# Patient Record
Sex: Female | Born: 1950 | Race: White | Hispanic: No | Marital: Married | Smoking: Former smoker
Health system: Southern US, Community
[De-identification: ages and names within clinical notes are randomized; demographics above are authoritative.]

## PROBLEM LIST (undated history)

## (undated) DIAGNOSIS — J45909 Unspecified asthma, uncomplicated: Secondary | ICD-10-CM

## (undated) HISTORY — PX: BREAST BIOPSY: SHX20

---

## 2015-06-04 ENCOUNTER — Emergency Department
Admission: EM | Admit: 2015-06-04 | Discharge: 2015-06-04 | Disposition: A | Discharge status: Home / Self Care | Payer: BLUE CROSS/BLUE SHIELD | Attending: Emergency Medicine | Admitting: Emergency Medicine

## 2015-06-04 ENCOUNTER — Emergency Department: Payer: BLUE CROSS/BLUE SHIELD

## 2015-06-04 ENCOUNTER — Encounter: Payer: Self-pay | Admitting: Emergency Medicine

## 2015-06-04 ENCOUNTER — Emergency Department
Admission: EM | Admit: 2015-06-04 | Discharge: 2015-06-04 | Disposition: A | Discharge status: Home / Self Care | Payer: BLUE CROSS/BLUE SHIELD | Source: Home / Self Care | Attending: Emergency Medicine | Admitting: Emergency Medicine

## 2015-06-04 DIAGNOSIS — R112 Nausea with vomiting, unspecified: Secondary | ICD-10-CM | POA: Diagnosis present

## 2015-06-04 DIAGNOSIS — Z791 Long term (current) use of non-steroidal anti-inflammatories (NSAID): Secondary | ICD-10-CM | POA: Diagnosis not present

## 2015-06-04 DIAGNOSIS — J45909 Unspecified asthma, uncomplicated: Secondary | ICD-10-CM | POA: Insufficient documentation

## 2015-06-04 DIAGNOSIS — M5412 Radiculopathy, cervical region: Secondary | ICD-10-CM

## 2015-06-04 DIAGNOSIS — R51 Headache: Secondary | ICD-10-CM | POA: Diagnosis not present

## 2015-06-04 DIAGNOSIS — Z87891 Personal history of nicotine dependence: Secondary | ICD-10-CM | POA: Insufficient documentation

## 2015-06-04 DIAGNOSIS — R519 Headache, unspecified: Secondary | ICD-10-CM

## 2015-06-04 HISTORY — DX: Unspecified asthma, uncomplicated: J45.909

## 2015-06-04 LAB — URINALYSIS COMPLETE WITH MICROSCOPIC (ARMC ONLY)
BACTERIA UA: NONE SEEN
Bilirubin Urine: NEGATIVE
Glucose, UA: NEGATIVE mg/dL
HGB URINE DIPSTICK: NEGATIVE
LEUKOCYTES UA: NEGATIVE
Nitrite: NEGATIVE
PH: 6 (ref 5.0–8.0)
PROTEIN: 100 mg/dL — AB
SPECIFIC GRAVITY, URINE: 1.027 (ref 1.005–1.030)

## 2015-06-04 LAB — COMPREHENSIVE METABOLIC PANEL
ALT: 39 U/L (ref 14–54)
AST: 27 U/L (ref 15–41)
Albumin: 4.8 g/dL (ref 3.5–5.0)
Alkaline Phosphatase: 109 U/L (ref 38–126)
Anion gap: 8 (ref 5–15)
BUN: 18 mg/dL (ref 6–20)
CHLORIDE: 105 mmol/L (ref 101–111)
CO2: 24 mmol/L (ref 22–32)
CREATININE: 0.55 mg/dL (ref 0.44–1.00)
Calcium: 9.2 mg/dL (ref 8.9–10.3)
Glucose, Bld: 131 mg/dL — ABNORMAL HIGH (ref 65–99)
POTASSIUM: 3.9 mmol/L (ref 3.5–5.1)
SODIUM: 137 mmol/L (ref 135–145)
Total Bilirubin: 0.6 mg/dL (ref 0.3–1.2)
Total Protein: 8.1 g/dL (ref 6.5–8.1)

## 2015-06-04 LAB — CBC
HEMATOCRIT: 40.7 % (ref 35.0–47.0)
HEMOGLOBIN: 14 g/dL (ref 12.0–16.0)
MCH: 30.9 pg (ref 26.0–34.0)
MCHC: 34.3 g/dL (ref 32.0–36.0)
MCV: 90.1 fL (ref 80.0–100.0)
PLATELETS: 336 10*3/uL (ref 150–440)
RBC: 4.51 MIL/uL (ref 3.80–5.20)
RDW: 13.1 % (ref 11.5–14.5)
WBC: 7.6 10*3/uL (ref 3.6–11.0)

## 2015-06-04 LAB — LIPASE, BLOOD: LIPASE: 21 U/L (ref 11–51)

## 2015-06-04 MED ORDER — IOHEXOL 350 MG/ML SOLN
100.0000 mL | Freq: Once | INTRAVENOUS | Status: AC | PRN
  Administered 2015-06-04: 100 mL via INTRAVENOUS

## 2015-06-04 MED ORDER — LORAZEPAM 2 MG/ML IJ SOLN
0.5000 mg | Freq: Once | INTRAMUSCULAR | Status: AC
  Administered 2015-06-04: 0.5 mg via INTRAVENOUS
  Filled 2015-06-04: qty 1

## 2015-06-04 MED ORDER — ONDANSETRON 4 MG PO TBDP
4.0000 mg | ORAL_TABLET | Freq: Once | ORAL | Status: AC
  Administered 2015-06-04: 4 mg via ORAL
  Filled 2015-06-04: qty 1

## 2015-06-04 MED ORDER — SODIUM CHLORIDE 0.9 % IV BOLUS (SEPSIS)
500.0000 mL | Freq: Once | INTRAVENOUS | Status: AC
  Administered 2015-06-04: 500 mL via INTRAVENOUS

## 2015-06-04 MED ORDER — ONDANSETRON 4 MG PO TBDP: 4.0000 mg | ORAL_TABLET | Freq: Three times a day (TID) | ORAL | Status: AC | PRN

## 2015-06-04 MED ORDER — ONDANSETRON HCL 4 MG/2ML IJ SOLN
4.0000 mg | Freq: Once | INTRAMUSCULAR | Status: AC
  Administered 2015-06-04: 4 mg via INTRAVENOUS
  Filled 2015-06-04: qty 2

## 2015-06-04 MED ORDER — ONDANSETRON HCL 4 MG/2ML IJ SOLN
INTRAMUSCULAR | Status: AC
  Administered 2015-06-04: 4 mg via INTRAVENOUS
  Filled 2015-06-04: qty 2

## 2015-06-04 MED ORDER — ONDANSETRON HCL 4 MG PO TABS: 4.0000 mg | ORAL_TABLET | Freq: Every day | ORAL | Status: AC | PRN

## 2015-06-04 MED ORDER — ONDANSETRON HCL 4 MG/2ML IJ SOLN
4.0000 mg | Freq: Once | INTRAMUSCULAR | Status: AC
  Administered 2015-06-04: 4 mg via INTRAVENOUS

## 2015-06-04 MED ORDER — HYDROMORPHONE HCL 1 MG/ML IJ SOLN
0.5000 mg | Freq: Once | INTRAMUSCULAR | Status: AC
Start: 2015-06-04 — End: 2015-06-04
  Administered 2015-06-04: 0.5 mg via INTRAVENOUS
  Filled 2015-06-04: qty 1

## 2015-06-04 MED ORDER — OXYCODONE HCL 5 MG PO TABS
5.0000 mg | ORAL_TABLET | Freq: Once | ORAL | Status: AC
  Administered 2015-06-04: 5 mg via ORAL
  Filled 2015-06-04: qty 1

## 2015-06-04 MED ORDER — OXYCODONE-ACETAMINOPHEN 5-325 MG PO TABS: 1.0000 | ORAL_TABLET | ORAL | Status: AC | PRN

## 2015-06-04 MED ORDER — SODIUM CHLORIDE 0.9 % IV SOLN
Freq: Once | INTRAVENOUS | Status: AC
  Administered 2015-06-04: 16:00:00 via INTRAVENOUS

## 2015-06-04 MED ORDER — OXYCODONE-ACETAMINOPHEN 5-325 MG PO TABS
1.0000 | ORAL_TABLET | Freq: Once | ORAL | Status: AC
  Administered 2015-06-04: 1 via ORAL
  Filled 2015-06-04: qty 1

## 2015-06-04 NOTE — ED Notes (Signed)
Pt returned from CT. NAD noted.

## 2015-06-04 NOTE — ED Provider Notes (Signed)
Signout from Dr. Mayford KnifeWilliams to follow up with the patient's MRI MRA. Patient was seen already this morning and diagnosed with cervical radiculopathy after noncontrast CAT scans. Then followed up with her orthopedic doctor, Dr. Lesleigh NoeKosinski who sent her to the emergency department. Physical Exam  BP 136/77 mmHg  Pulse 82  Temp(Src) 97.5 F (36.4 C) (Oral)  Resp 18  Ht 5' (1.524 m)  Wt 175 lb (79.379 kg)  BMI 34.18 kg/m2  SpO2 97% ----------------------------------------- 9:41 PM on 06/04/2015 -----------------------------------------  CT Angio Neck W/Cm &/Or Wo/Cm (Final result) Result time: 06/04/15 20:47:29   Final result by Rad Results In Interface (06/04/15 20:47:29)   Narrative:   CLINICAL DATA: Abnormal left vertebral artery on MRA. Rule out dissection. Headache and vomiting.  EXAM: CT ANGIOGRAPHY NECK  TECHNIQUE: Multidetector CT imaging of the neck was performed using the standard protocol during bolus administration of intravenous contrast. Multiplanar CT image reconstructions and MIPs were obtained to evaluate the vascular anatomy. Carotid stenosis measurements (when applicable) are obtained utilizing NASCET criteria, using the distal internal carotid diameter as the denominator.  CONTRAST: 100mL OMNIPAQUE IOHEXOL 350 MG/ML SOLN  COMPARISON: MRA head today  FINDINGS: Aortic arch: Mild atherosclerotic calcification in the aortic arch. No aneurysm or dissection. Proximal great vessels patent. Lung apices clear  Right carotid system: Right carotid widely patent. No atherosclerotic disease or dissection  Left carotid system: left carotid widely patent. No atherosclerotic disease or dissection  Vertebral arteries:The right vertebral artery is normal and widely patent. Left vertebral artery is hypoplastic and diffusely small. No caliber change or dissection. This accounts for the MRA head finding. There is a very small left vertebral artery contributing to left  PICA and the basilar. Right PICA also patent.  Skeleton: Mild disc degeneration and spurring. No acute skeletal abnormality.  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 No evidence of dissection.   Electronically Signed By: Marlan Palauharles Clark M.D. On: 06/04/2015 20:47          MR Brain Wo Contrast (Final result) Result time: 06/04/15 17:19:25   Final result by Rad Results In Interface (06/04/15 17:19:25)   Narrative:   CLINICAL DATA: Severe headache. Neck pain and vomiting.  EXAM: MRI HEAD WITHOUT CONTRAST  MRA HEAD WITHOUT CONTRAST  TECHNIQUE: Multiplanar, multiecho pulse sequences of the brain and surrounding structures were obtained without intravenous contrast. Angiographic images of the head were obtained using MRA technique without contrast.  COMPARISON: CT head 06/04/2015  FINDINGS: MRI HEAD FINDINGS  Negative for acute infarct. Several small white matter hyperintensities bilaterally appear chronic and nonspecific. Brainstem and cerebellum normal. Basal ganglia normal.  Ventricle size normal. Cerebral volume normal.  Negative for intracranial hemorrhage. Negative for mass or edema.  Pituitary normal in size. Paranasal sinuses clear. Normal orbit.  MRA HEAD FINDINGS  Mild stenosis distal right vertebral artery. Right PICA patent.  Left vertebral artery not visualized and may be occluded. Basilar is patent. Superior cerebellar and posterior cerebral arteries patent bilaterally.  Internal carotid artery widely patent without stenosis or aneurysm. Anterior and middle cerebral arteries widely patent bilaterally. No vascular malformation identified.  IMPRESSION: No acute intracranial abnormality. Small white matter hyperintensities are nonspecific but likely related to chronic microvascular  ischemia.  Nonvisualization of distal left vertebral artery. This could be due to acute or chronic occlusion. If there is concern of vertebral artery dissection, CTA of the neck is suggested for further  evaluation.   Electronically Signed By: Marlan Palau M.D. On: 06/04/2015 17:19          MR Angiogram Head Wo Contrast (Final result) Result time: 06/04/15 17:19:25   Final result by Rad Results In Interface (06/04/15 17:19:25)   Narrative:   CLINICAL DATA: Severe headache. Neck pain and vomiting.  EXAM: MRI HEAD WITHOUT CONTRAST  MRA HEAD WITHOUT CONTRAST  TECHNIQUE: Multiplanar, multiecho pulse sequences of the brain and surrounding structures were obtained without intravenous contrast. Angiographic images of the head were obtained using MRA technique without contrast.  COMPARISON: CT head 06/04/2015  FINDINGS: MRI HEAD FINDINGS  Negative for acute infarct. Several small white matter hyperintensities bilaterally appear chronic and nonspecific. Brainstem and cerebellum normal. Basal ganglia normal.  Ventricle size normal. Cerebral volume normal.  Negative for intracranial hemorrhage. Negative for mass or edema.  Pituitary normal in size. Paranasal sinuses clear. Normal orbit.  MRA HEAD FINDINGS  Mild stenosis distal right vertebral artery. Right PICA patent.  Left vertebral artery not visualized and may be occluded. Basilar is patent. Superior cerebellar and posterior cerebral arteries patent bilaterally.  Internal carotid artery widely patent without stenosis or aneurysm. Anterior and middle cerebral arteries widely patent bilaterally. No vascular malformation identified.  IMPRESSION: No acute intracranial abnormality. Small white matter hyperintensities are nonspecific but likely related to chronic microvascular ischemia.  Nonvisualization of distal left vertebral artery. This could be due to acute or chronic occlusion. If there is  concern of vertebral artery dissection, CTA of the neck is suggested for further evaluation.   Electronically Signed By: Marlan Palau M.D. On: 06/04/2015 17:19        Physical Exam Patient resting comfortably at this time. Denies any pain. No tenderness palpation to the bilateral neck. ED Course  Procedures  MDM No acute findings on the patient's imaging. 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 I discussed the results with the patient as well as her family who is at the bedside. She'll be going home and will follow Dr. Martha Clan. She will continue to use aspirin. I'll be discharging her with Zofran. I also recommended that she try muscle cream such as Aspercreme or icy hot for continued relief. No acute findings found during extensive workup today.      Myrna Blazer, MD 06/05/15 [phone]

## 2015-06-04 NOTE — ED Notes (Signed)
Patient ambulatory to triage with steady gait, without difficulty or distress noted; pt reports recent sinus congestion & cough; now with HA from base of head radiating up

## 2015-06-04 NOTE — ED Notes (Signed)
Patient transported to CT 

## 2015-06-04 NOTE — ED Notes (Signed)
Pt returned from MRI °

## 2015-06-04 NOTE — ED Notes (Signed)
She was seen this am and instructed to go to another md concerning her neck/head pain .Jillian Kaiser. After she got there she developed vomiting and was instructed to return to ed

## 2015-06-04 NOTE — ED Provider Notes (Signed)
[facility] Emergency Department Provider Note     Time seen: ----------------------------------------- 3:11 PM on 06/04/2015 -----------------------------------------    I have reviewed the triage vital signs and the nursing notes.   HISTORY  Chief Complaint Emesis    HPI Jillian Kaiser is a 65 y.o. female who presents to ER for intense headache, neck pain and vomiting. Patient was seen here this morning, has a history of same chronically and had a negative CT head and C-spine earlier this morning. She was then referred for follow-up with orthopedics, will see the orthopedist and was told to come back to the ER because she was feeling worse and having more vomiting with worsening pain. Patient describes a shooting pain from her head or neck with associated dizziness. Nothing makes this better. She has no associated numbness tingling or weakness.   History reviewed. No pertinent past medical history.  There are no active problems to display for this patient.   History reviewed. No pertinent past surgical history.  Allergies Review of patient's allergies indicates no known allergies.  Social History Social History  Substance Use Topics  . Smoking status: Former Games developermoker  . Smokeless tobacco: None  . Alcohol Use: No    Review of Systems Constitutional: Negative for fever. Eyes: Negative for visual changes. ENT: Negative for sore throat. Cardiovascular: Negative for chest pain. Respiratory: Negative for shortness of breath. Gastrointestinal: Negative for abdominal pain, Positive for vomiting Genitourinary: Negative for dysuria. Musculoskeletal: Negative for back pain. Positive for neck pain Skin: Negative for rash. Neurological: Positive for headache  10-point ROS otherwise negative.  ____________________________________________   PHYSICAL EXAM:  VITAL SIGNS: ED Triage Vitals  Enc Vitals Group     BP 06/04/15 1159 152/83 mmHg      Pulse Rate 06/04/15 1159 70     Resp 06/04/15 1159 18     Temp 06/04/15 1159 97.5 F (36.4 C)     Temp Source 06/04/15 1159 Oral     SpO2 06/04/15 1159 98 %     Weight 06/04/15 1159 175 lb (79.379 kg)     Height 06/04/15 1159 5' (1.524 m)     Head Cir --      Peak Flow --      Pain Score 06/04/15 1200 10     Pain Loc --      Pain Edu? --      Excl. in GC? --    Constitutional: Alert and oriented. Mild distress Eyes: Conjunctivae are normal. PERRL. Normal extraocular movements. ENT   Head: Normocephalic and atraumatic.   Nose: No congestion/rhinnorhea.   Mouth/Throat: Mucous membranes are moist.   Neck: No stridor. Cardiovascular: Normal rate, regular rhythm. Normal and symmetric distal pulses are present in all extremities. No murmurs, rubs, or gallops. Respiratory: Normal respiratory effort without tachypnea nor retractions. Breath sounds are clear and equal bilaterally. No wheezes/rales/rhonchi. Gastrointestinal: Soft and nontender. No distention. No abdominal bruits.  Musculoskeletal: Nontender with normal range of motion in all extremities. No joint effusions.  No lower extremity tenderness nor edema. Neurologic:  Normal speech and language. No gross focal neurologic deficits are appreciated. Motor and sensory function appears to be intact, cranial nerves appear to be intact. Skin:  Skin is warm, dry and intact. No rash noted. Psychiatric: Mood and affect are normal. Speech and behavior are normal. Patient exhibits appropriate insight and judgment. ____________________________________________  ED COURSE:  Pertinent labs & imaging results that were available during my care of the patient were reviewed by  me and considered in my medical decision making (see chart for details). Patient with severe posterior headache and vomiting of uncertain etiology. I ordered an MRI and MRA for further evaluation. Patient receive IV fluids, Dilaudid and  Ativan. ____________________________________________    LABS (pertinent positives/negatives)  Labs Reviewed  COMPREHENSIVE METABOLIC PANEL - Abnormal; Notable for the following:    Glucose, Bld 131 (*)    All other components within normal limits  LIPASE, BLOOD  CBC  URINALYSIS COMPLETEWITH MICROSCOPIC ([facility] ONLY)    RADIOLOGY Images were viewed by me  MRI and MRA are pending at this time.  ____________________________________________  FINAL ASSESSMENT AND PLAN  Headache, vomiting  Plan: Patient with labs and imaging as dictated above. Patient has an MRI and MRA that are pending. Patient care of be checked out to Dr.Shaevitz for final disposition.   Emily Filbert, MD   Emily Filbert, MD 06/04/15 1520

## 2015-06-04 NOTE — ED Notes (Addendum)
presents with headache and abd pain with nausea/vomiting    States her pain in her head shoot into back of neck.. Sx's started on Tuesday and became worse this am  Dizziness  States today the headaches is "worse ever"

## 2015-06-04 NOTE — ED Notes (Signed)
Patient in NAD. A&0 x 4. Ambulatory.

## 2015-06-04 NOTE — ED Notes (Signed)
Report from Kim, RN

## 2015-06-04 NOTE — ED Notes (Signed)
Patient transported to CT via stretcher.
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
## 2015-06-04 NOTE — ED Notes (Signed)
Pt states Zofran given in triage helped but when she opens her eyes she becomes nauseous and rates her headache at a 9.

## 2015-06-04 NOTE — ED Notes (Signed)
Patient transported to MRI 

## 2015-06-04 NOTE — Discharge Instructions (Signed)

## 2015-06-08 ENCOUNTER — Other Ambulatory Visit: Payer: Self-pay | Admitting: Orthopedic Surgery

## 2015-06-08 DIAGNOSIS — M542 Cervicalgia: Secondary | ICD-10-CM

## 2015-06-11 ENCOUNTER — Ambulatory Visit: Payer: BLUE CROSS/BLUE SHIELD

## 2015-07-01 ENCOUNTER — Ambulatory Visit
Admission: RE | Admit: 2015-07-01 | Discharge: 2015-07-01 | Disposition: A | Discharge status: Home / Self Care | Payer: BLUE CROSS/BLUE SHIELD | Source: Ambulatory Visit | Attending: Orthopedic Surgery | Admitting: Orthopedic Surgery

## 2015-07-01 DIAGNOSIS — M47812 Spondylosis without myelopathy or radiculopathy, cervical region: Secondary | ICD-10-CM | POA: Diagnosis not present

## 2015-07-01 DIAGNOSIS — M4802 Spinal stenosis, cervical region: Secondary | ICD-10-CM | POA: Diagnosis not present

## 2015-07-01 DIAGNOSIS — M542 Cervicalgia: Secondary | ICD-10-CM

## 2015-08-13 ENCOUNTER — Other Ambulatory Visit: Payer: Self-pay | Admitting: Internal Medicine

## 2015-08-13 DIAGNOSIS — Z1231 Encounter for screening mammogram for malignant neoplasm of breast: Secondary | ICD-10-CM

## 2015-09-09 ENCOUNTER — Ambulatory Visit
Admission: RE | Admit: 2015-09-09 | Discharge: 2015-09-09 | Disposition: A | Discharge status: Home / Self Care | Payer: BLUE CROSS/BLUE SHIELD | Source: Ambulatory Visit | Attending: Internal Medicine | Admitting: Internal Medicine

## 2015-09-09 ENCOUNTER — Other Ambulatory Visit: Payer: Self-pay | Admitting: Internal Medicine

## 2015-09-09 DIAGNOSIS — Z1231 Encounter for screening mammogram for malignant neoplasm of breast: Secondary | ICD-10-CM

## 2016-02-01 ENCOUNTER — Other Ambulatory Visit: Payer: Self-pay | Admitting: Gastroenterology

## 2016-02-01 DIAGNOSIS — R1310 Dysphagia, unspecified: Secondary | ICD-10-CM

## 2016-02-07 ENCOUNTER — Ambulatory Visit
Admission: RE | Admit: 2016-02-07 | Discharge: 2016-02-07 | Disposition: A | Discharge status: Home / Self Care | Payer: BLUE CROSS/BLUE SHIELD | Source: Ambulatory Visit | Attending: Gastroenterology | Admitting: Gastroenterology

## 2016-02-07 DIAGNOSIS — R1310 Dysphagia, unspecified: Secondary | ICD-10-CM | POA: Insufficient documentation

## 2016-02-07 DIAGNOSIS — K449 Diaphragmatic hernia without obstruction or gangrene: Secondary | ICD-10-CM | POA: Insufficient documentation

## 2018-09-27 IMAGING — RF DG ESOPHAGUS
3 series · 13 of 13 positions shown · non-contrast
Comparison: None in PACs

CLINICAL DATA: Several years of dysphagia with mid back pain
associated with episodes of food being stuck. Solids worse than
liquids. History of gastroesophageal reflux.

EXAM:
ESOPHOGRAM / BARIUM SWALLOW / BARIUM TABLET STUDY
TECHNIQUE: Combined double contrast and single contrast examination performed
using effervescent crystals, thick barium liquid, and thin barium
liquid. The patient was observed with fluoroscopy swallowing a 13 mm
barium sulphate tablet.
FLUOROSCOPY TIME:  Fluoroscopy Time:  30 seconds
Radiation Exposure Index (if provided by the fluoroscopic device):
612.08 micro Gy per meter squared
Number of Acquired Spot Images: 7+ 2 video loops

[Series 1: fluoro_barium 2fps_bw · 0.17mm/px · 7 of 7 slices shown (1 of 3)]
[im 1/7]
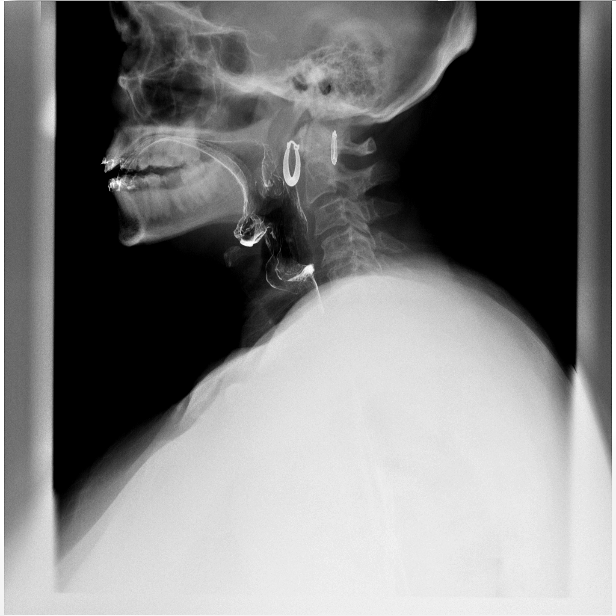
[im 2/7]
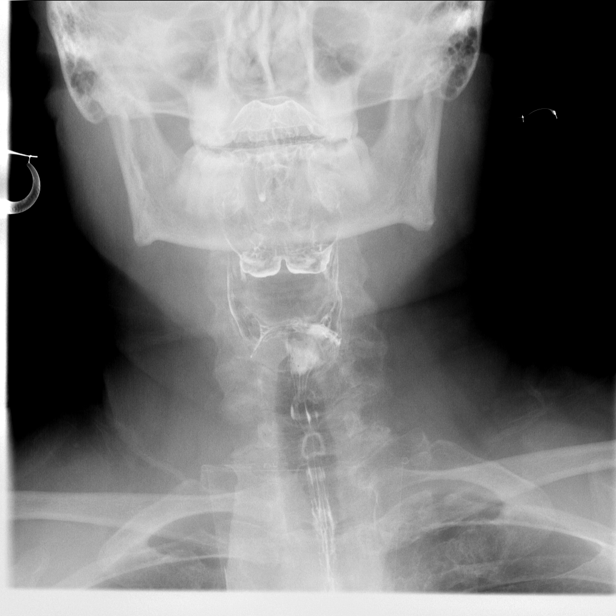
[im 3/7]
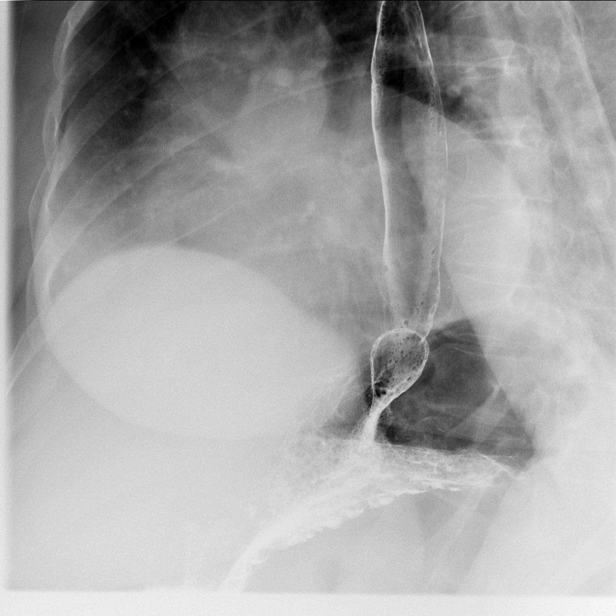
[im 4/7]
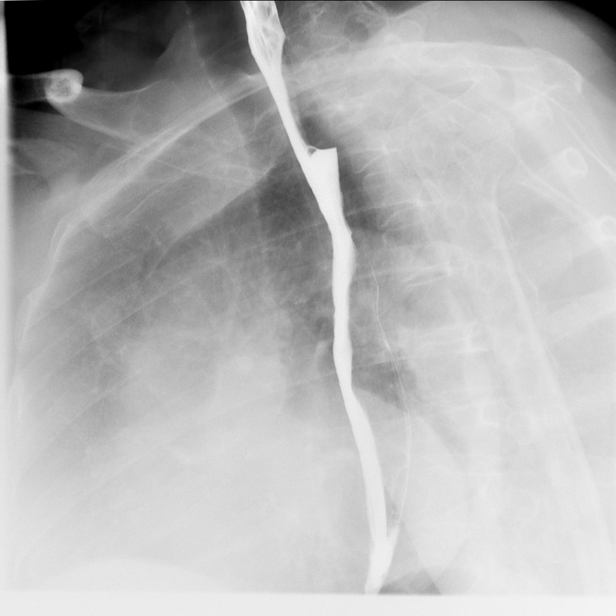
[im 5/7]
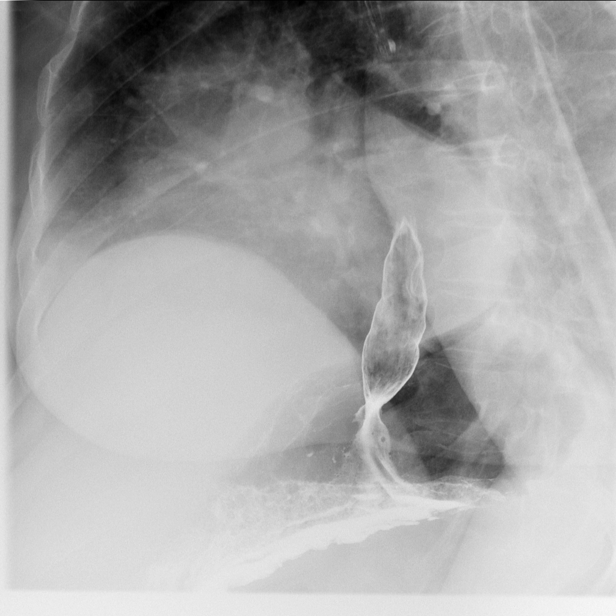
[im 6/7]
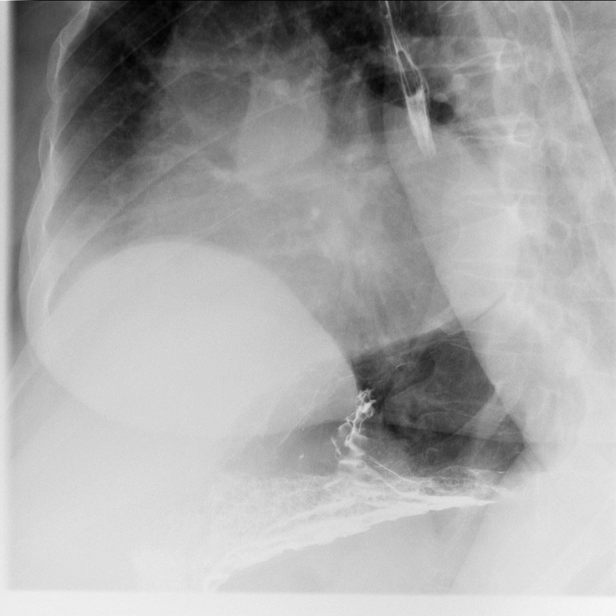
[im 7/7]
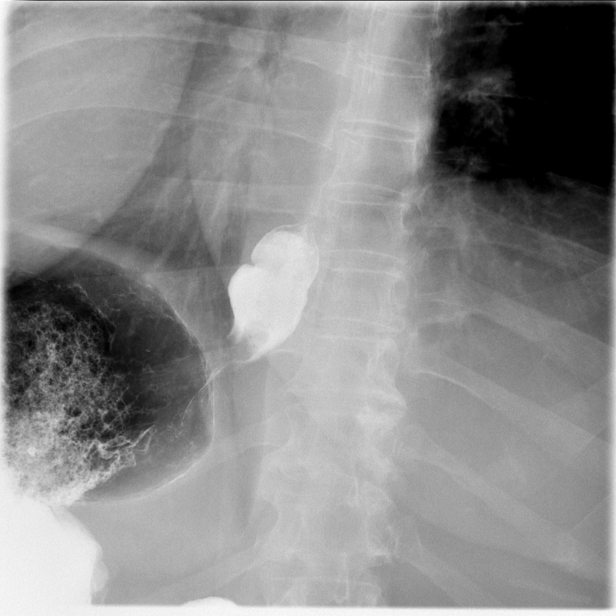

[Series 8: fluoro_barium 2fps_bw · 0.18mm/px · 3 of 8 frames shown (2 of 3)]
[frame 2/8]
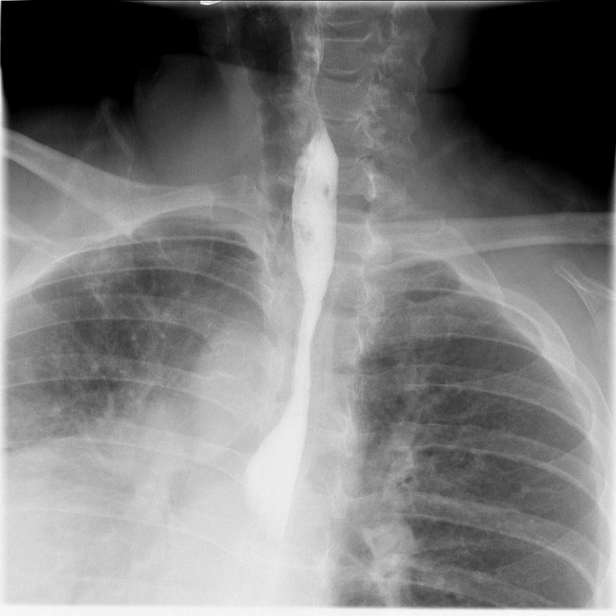
[frame 5/8]
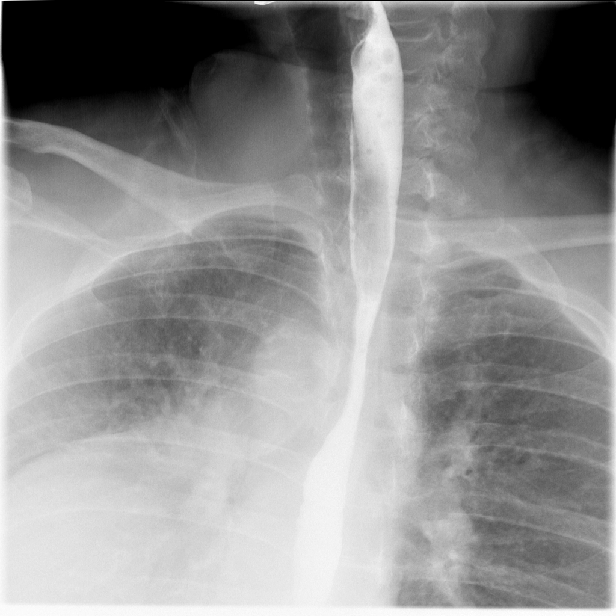
[frame 7/8]
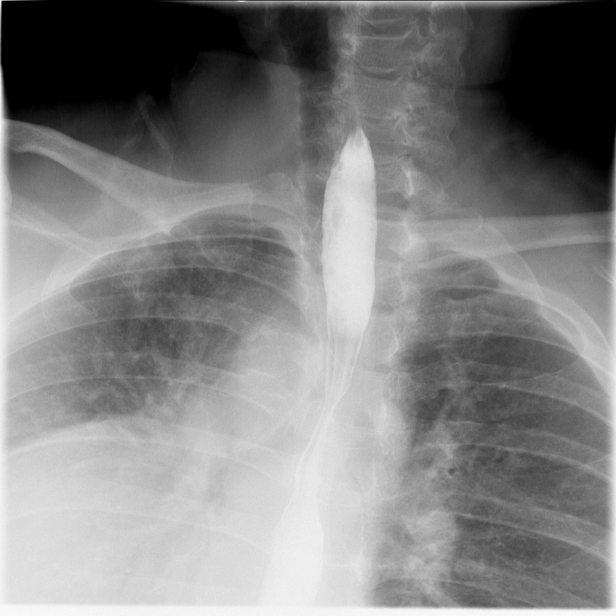

[Series 9: fluoro_barium 2fps_bw · 0.18mm/px · 3 of 10 frames shown (3 of 3)]
[frame 2/10]
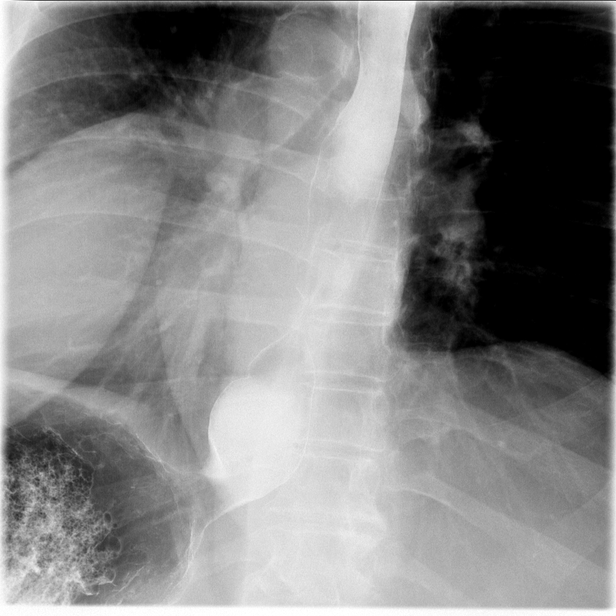
[frame 6/10]
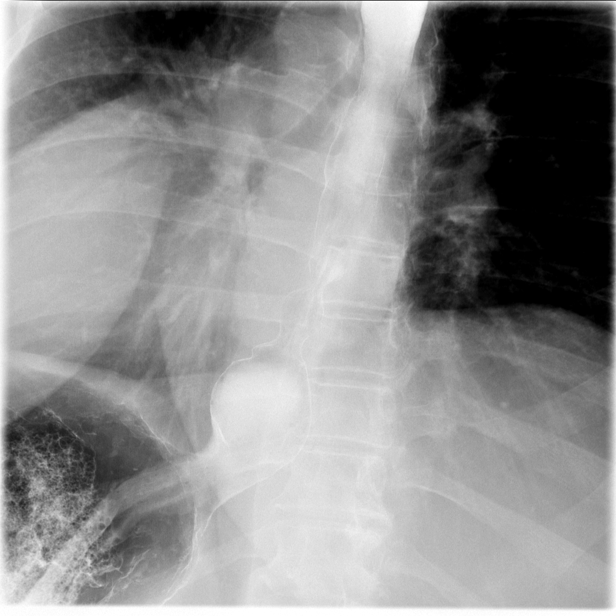
[frame 9/10]
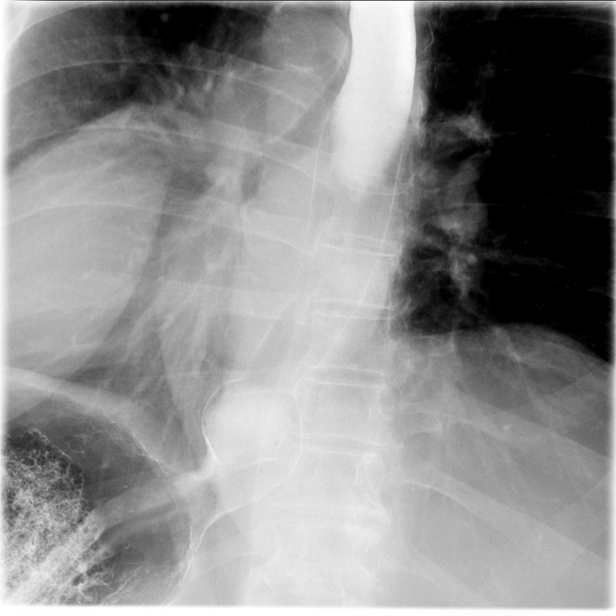

[13 of 13 positions shown; findings below may reference images not displayed]

FINDINGS: The patient ingested thick and thin barium and the gas-forming
crystals without difficulty. The cervical esophagus distended well.
There was no laryngeal penetration of the barium. The thoracic
esophagus distended well. A small reducible hiatal hernia was
observed. There was no evidence of stricture nor esophagitis. A few
tertiary contractions were transiently observed. The barium tablet
passed without difficulty.
IMPRESSION: Small reducible hiatal hernia without high-grade stricture nor
evidence of esophagitis. A few tertiary contractions were observed
consistent with presbyesophagus. There was no obstruction to passage
of the barium tablet.
